# Patient Record
Sex: Male | Born: 2000 | Race: White | Hispanic: No | Marital: Married | Smoking: Never smoker
Health system: Southern US, Community
[De-identification: ages and names within clinical notes are randomized; demographics above are authoritative.]

## PROBLEM LIST (undated history)

## (undated) DIAGNOSIS — G4700 Insomnia, unspecified: Secondary | ICD-10-CM

## (undated) DIAGNOSIS — F938 Other childhood emotional disorders: Secondary | ICD-10-CM

## (undated) HISTORY — PX: TONSILLECTOMY: SUR1361

---
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
## 2018-11-21 ENCOUNTER — Emergency Department (HOSPITAL_COMMUNITY): Payer: Managed Care, Other (non HMO)

## 2018-11-21 ENCOUNTER — Encounter (HOSPITAL_COMMUNITY): Payer: Self-pay | Admitting: *Deleted

## 2018-11-21 ENCOUNTER — Emergency Department (HOSPITAL_COMMUNITY)
Admission: EM | Admit: 2018-11-21 | Discharge: 2018-11-21 | Disposition: A | Discharge status: Home / Self Care | Payer: Managed Care, Other (non HMO) | Attending: Emergency Medicine | Admitting: Emergency Medicine

## 2018-11-21 ENCOUNTER — Other Ambulatory Visit: Payer: Self-pay

## 2018-11-21 DIAGNOSIS — S60511A Abrasion of right hand, initial encounter: Secondary | ICD-10-CM | POA: Insufficient documentation

## 2018-11-21 DIAGNOSIS — Z23 Encounter for immunization: Secondary | ICD-10-CM | POA: Insufficient documentation

## 2018-11-21 DIAGNOSIS — T148XXA Other injury of unspecified body region, initial encounter: Secondary | ICD-10-CM

## 2018-11-21 DIAGNOSIS — S30810A Abrasion of lower back and pelvis, initial encounter: Secondary | ICD-10-CM | POA: Diagnosis not present

## 2018-11-21 DIAGNOSIS — S82892A Other fracture of left lower leg, initial encounter for closed fracture: Secondary | ICD-10-CM | POA: Diagnosis not present

## 2018-11-21 DIAGNOSIS — Y92410 Unspecified street and highway as the place of occurrence of the external cause: Secondary | ICD-10-CM | POA: Insufficient documentation

## 2018-11-21 DIAGNOSIS — S93402A Sprain of unspecified ligament of left ankle, initial encounter: Secondary | ICD-10-CM

## 2018-11-21 DIAGNOSIS — Y999 Unspecified external cause status: Secondary | ICD-10-CM | POA: Diagnosis not present

## 2018-11-21 DIAGNOSIS — Y939 Activity, unspecified: Secondary | ICD-10-CM | POA: Insufficient documentation

## 2018-11-21 DIAGNOSIS — S99912A Unspecified injury of left ankle, initial encounter: Secondary | ICD-10-CM | POA: Diagnosis present

## 2018-11-21 MED ORDER — TETANUS-DIPHTH-ACELL PERTUSSIS 5-2.5-18.5 LF-MCG/0.5 IM SUSP
0.5000 mL | Freq: Once | INTRAMUSCULAR | Status: AC
  Administered 2018-11-21: 21:00:00 0.5 mL via INTRAMUSCULAR
  Filled 2018-11-21: qty 0.5

## 2018-11-21 MED ORDER — CYCLOBENZAPRINE HCL 10 MG PO TABS: 10.0000 mg | ORAL_TABLET | Freq: Two times a day (BID) | ORAL | 0 refills | Status: AC | PRN

## 2018-11-21 MED ORDER — OXYCODONE-ACETAMINOPHEN 5-325 MG PO TABS
1.0000 | ORAL_TABLET | Freq: Once | ORAL | Status: AC
  Administered 2018-11-21: 1 via ORAL
  Filled 2018-11-21: qty 1

## 2018-11-21 MED ORDER — SILVER SULFADIAZINE 1 % EX CREA
TOPICAL_CREAM | Freq: Once | CUTANEOUS | Status: AC
  Administered 2018-11-21: 21:00:00 via TOPICAL
  Filled 2018-11-21: qty 50

## 2018-11-21 MED ORDER — HYDROCODONE-ACETAMINOPHEN 5-325 MG PO TABS: 1.0000 | ORAL_TABLET | Freq: Four times a day (QID) | ORAL | 0 refills | Status: AC | PRN
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
## 2018-11-21 NOTE — Discharge Instructions (Addendum)
Please read instructions below. Apply ice to your areas of pain for 20 minutes at a time.  Keep your foot elevated as much as possible. Keep your wounds clean.  You can apply the Silvadene cream 1-2 times daily as needed for pain. You can take 600 mg of Advil/ibuprofen every 6 hours as needed for pain. You can take flexeril every 12 hours as needed for muscle spasm. Be aware this medication can make you drowsy. You can take hydrocodone every 6 hours as needed for severe pain.  There is Tylenol in this medication.  Be aware this medication can make you drowsy, do not drive or drink alcohol while taking this medication. Schedule an appointment with the orthopedic specialist in 1 week to follow-up on your injury.   Return to the ER for severe headache, vision changes, if new numbness or tingling in your arms or legs, inability to urinate, inability to hold your bowels, or weakness in your extremities.

## 2018-11-21 NOTE — ED Notes (Signed)
Bed: WA02 Expected date:  Expected time:  Means of arrival:  Comments: Triage 1  
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
## 2019-12-25 ENCOUNTER — Emergency Department (HOSPITAL_BASED_OUTPATIENT_CLINIC_OR_DEPARTMENT_OTHER): Payer: Managed Care, Other (non HMO)

## 2019-12-25 ENCOUNTER — Other Ambulatory Visit: Payer: Self-pay

## 2019-12-25 ENCOUNTER — Encounter (HOSPITAL_BASED_OUTPATIENT_CLINIC_OR_DEPARTMENT_OTHER): Payer: Self-pay | Admitting: *Deleted

## 2019-12-25 ENCOUNTER — Emergency Department (HOSPITAL_BASED_OUTPATIENT_CLINIC_OR_DEPARTMENT_OTHER)
Admission: EM | Admit: 2019-12-25 | Discharge: 2019-12-25 | Disposition: A | Discharge status: Home / Self Care | Payer: Managed Care, Other (non HMO) | Attending: Emergency Medicine | Admitting: Emergency Medicine

## 2019-12-25 DIAGNOSIS — Y999 Unspecified external cause status: Secondary | ICD-10-CM | POA: Insufficient documentation

## 2019-12-25 DIAGNOSIS — Y9289 Other specified places as the place of occurrence of the external cause: Secondary | ICD-10-CM | POA: Diagnosis not present

## 2019-12-25 DIAGNOSIS — S8991XA Unspecified injury of right lower leg, initial encounter: Secondary | ICD-10-CM | POA: Diagnosis not present

## 2019-12-25 DIAGNOSIS — S80811A Abrasion, right lower leg, initial encounter: Secondary | ICD-10-CM | POA: Insufficient documentation

## 2019-12-25 DIAGNOSIS — M79604 Pain in right leg: Secondary | ICD-10-CM | POA: Diagnosis not present

## 2019-12-25 DIAGNOSIS — L03115 Cellulitis of right lower limb: Secondary | ICD-10-CM

## 2019-12-25 DIAGNOSIS — Y9389 Activity, other specified: Secondary | ICD-10-CM | POA: Insufficient documentation

## 2019-12-25 DIAGNOSIS — M7989 Other specified soft tissue disorders: Secondary | ICD-10-CM

## 2019-12-25 DIAGNOSIS — T07XXXA Unspecified multiple injuries, initial encounter: Secondary | ICD-10-CM

## 2019-12-25 MED ORDER — CEPHALEXIN 500 MG PO CAPS: 500.0000 mg | ORAL_CAPSULE | Freq: Four times a day (QID) | ORAL | 0 refills | Status: AC
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
## 2019-12-25 NOTE — Discharge Instructions (Signed)
Take Keflex as prescribed and complete the full course. STOP using peroxide to clean wounds. Use a mild soap, apply Bacitracin ointment to areas twice daily. Recheck with your doctor in 2 days, return to ER for fever, worsening or concerning symptoms.

## 2020-11-17 IMAGING — US US EXTREM LOW VENOUS*R*
1 series · 13 of 24 positions shown · non-contrast
Comparison: None.

CLINICAL DATA: Right leg pain and swelling, recent injury



[Series 1: us extrem low venous*right* · 13 of 34 slices shown]
[im 1/34]
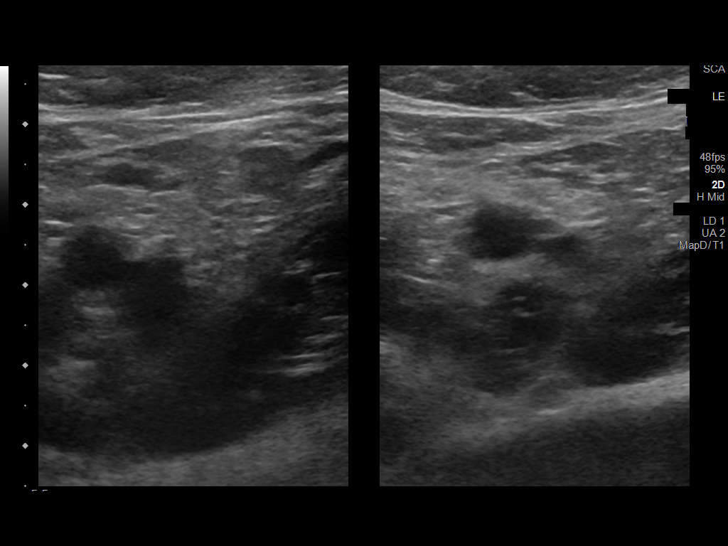
[im 3/34]
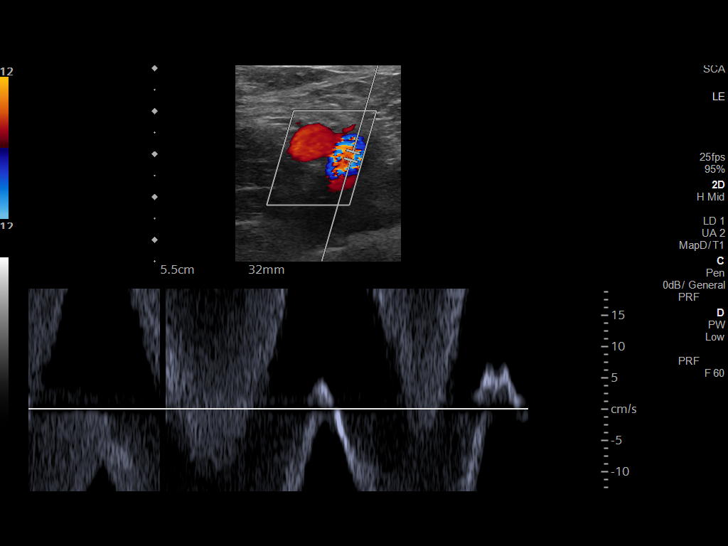
[im 6/34]
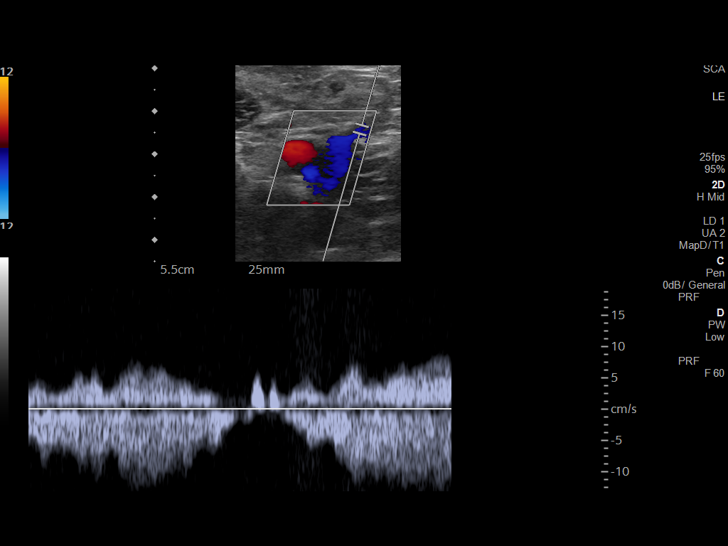
[im 9/34]
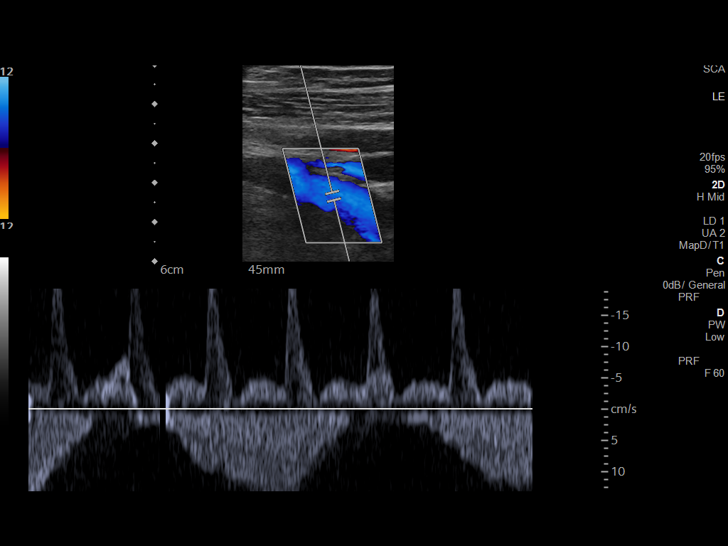
[im 12/34]
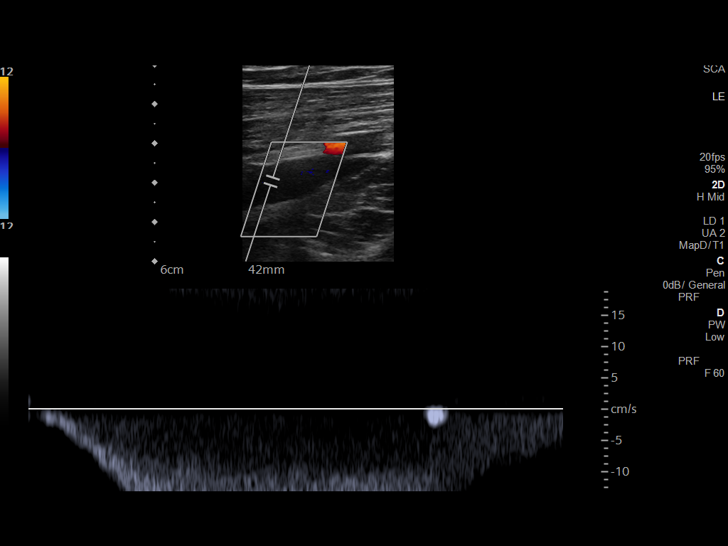
[im 15/34]
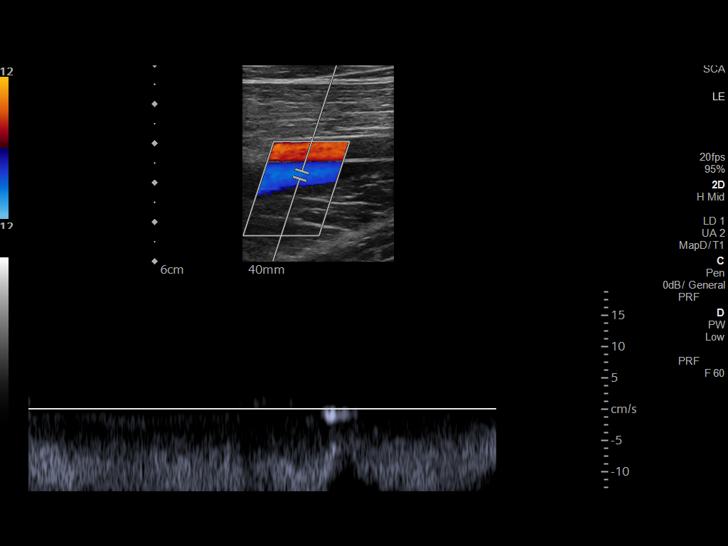
[im 18/34]
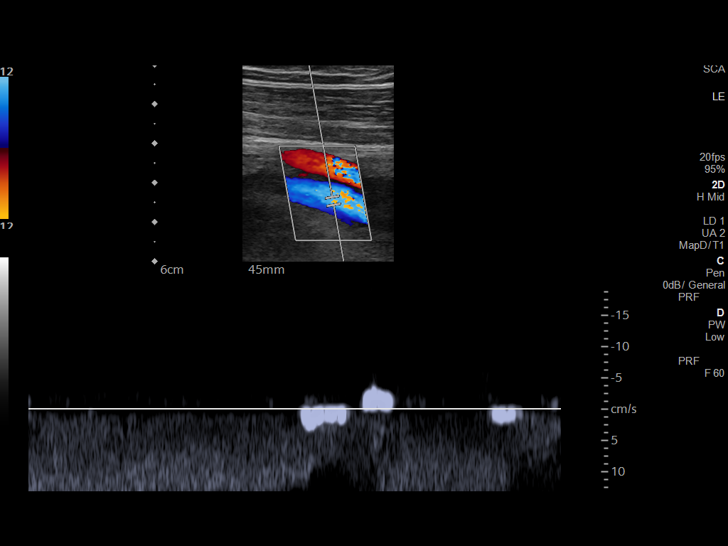
[im 19/34]
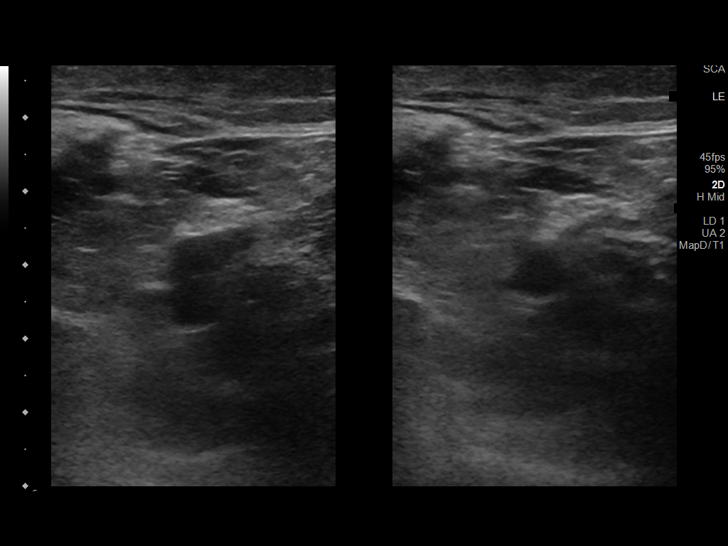
[im 22/34]
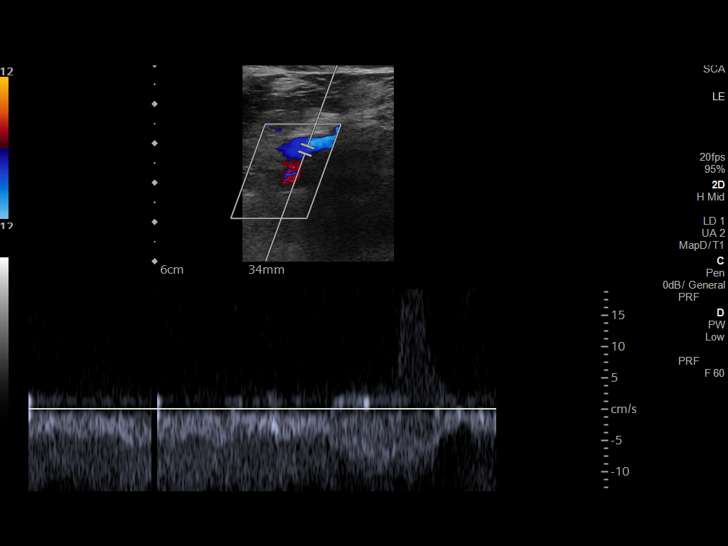
[im 25/34]
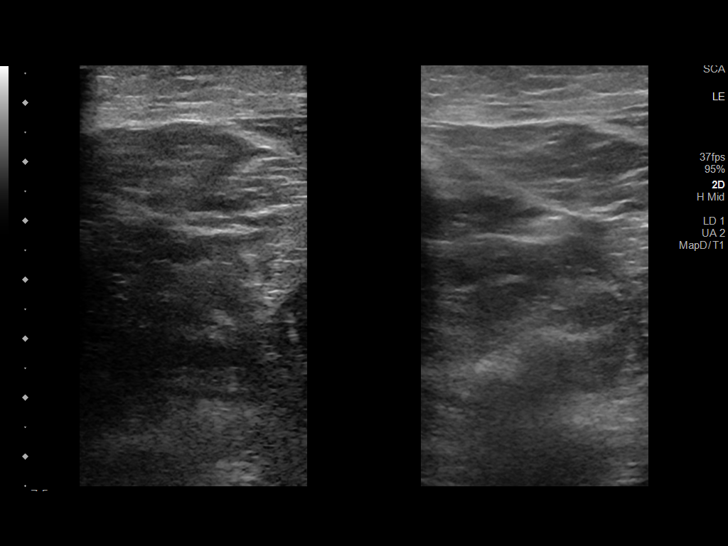
[im 28/34]
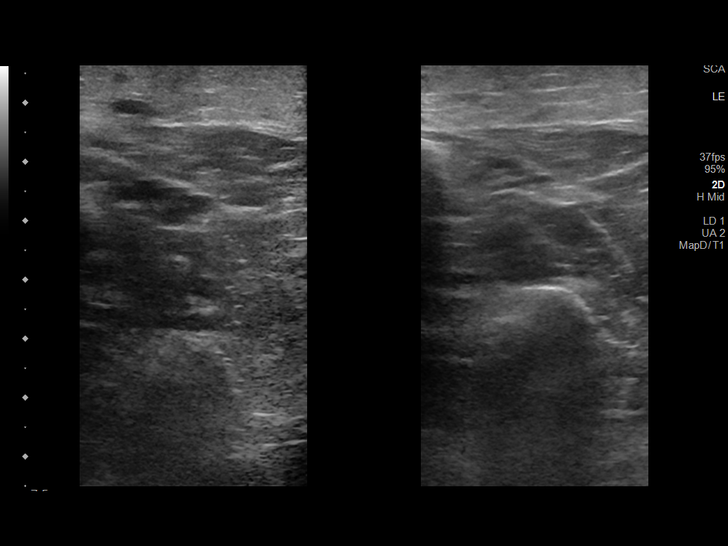
[im 31/34]
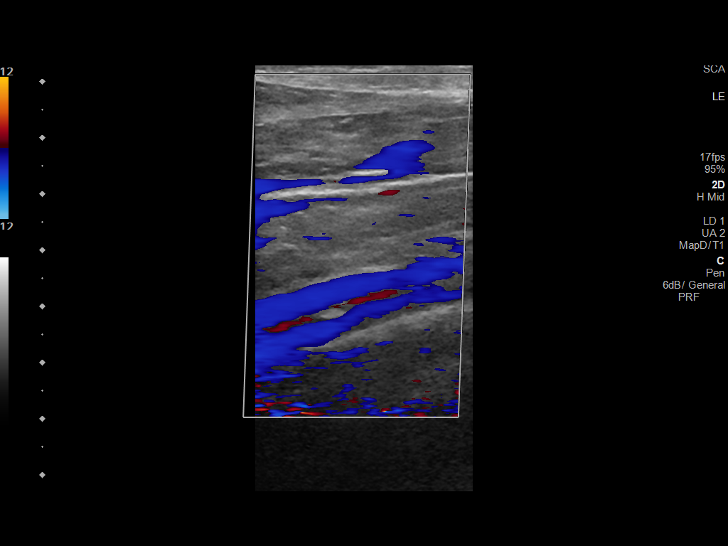
[im 34/34]
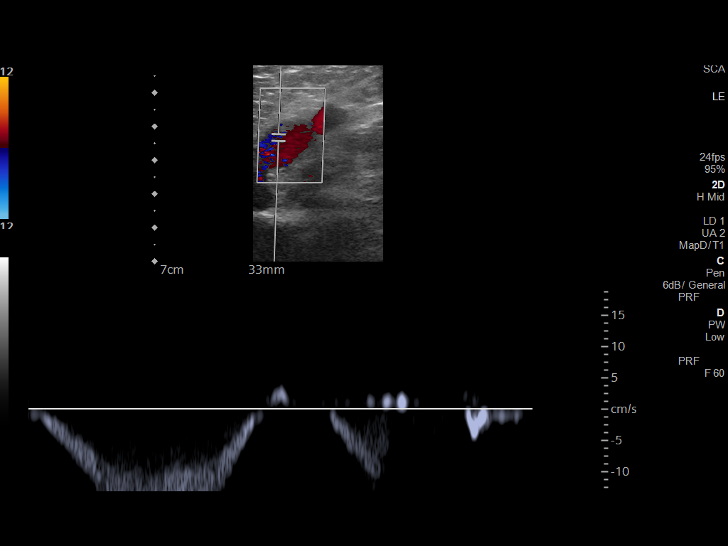

[13 of 24 positions shown; findings below may reference images not displayed]

FINDINGS: Contralateral Common Femoral Vein: Respiratory phasicity is normal
and symmetric with the symptomatic side. No evidence of thrombus.
Normal compressibility.

Common Femoral Vein: No evidence of thrombus. Normal
compressibility, respiratory phasicity and response to augmentation.

Saphenofemoral Junction: No evidence of thrombus. Normal
compressibility and flow on color Doppler imaging.

Profunda Femoral Vein: No evidence of thrombus. Normal
compressibility and flow on color Doppler imaging.

Femoral Vein: No evidence of thrombus. Normal compressibility,
respiratory phasicity and response to augmentation.

Popliteal Vein: No evidence of thrombus. Normal compressibility,
respiratory phasicity and response to augmentation.

Calf Veins: No evidence of thrombus. Normal compressibility and flow
on color Doppler imaging.

Superficial Great Saphenous Vein: No evidence of thrombus. Normal
compressibility.

Venous Reflux:  None.

Other Findings:  None.
IMPRESSION: No evidence of deep venous thrombosis.

## 2022-12-23 ENCOUNTER — Other Ambulatory Visit: Payer: Self-pay

## 2022-12-23 ENCOUNTER — Emergency Department (HOSPITAL_COMMUNITY)
Admission: EM | Admit: 2022-12-23 | Discharge: 2022-12-23 | Disposition: A | Discharge status: Home / Self Care | Payer: BC Managed Care – PPO | Attending: Emergency Medicine | Admitting: Emergency Medicine

## 2022-12-23 DIAGNOSIS — K209 Esophagitis, unspecified without bleeding: Secondary | ICD-10-CM

## 2022-12-23 DIAGNOSIS — R09A2 Foreign body sensation, throat: Secondary | ICD-10-CM | POA: Diagnosis not present

## 2022-12-23 DIAGNOSIS — X58XXXA Exposure to other specified factors, initial encounter: Secondary | ICD-10-CM | POA: Insufficient documentation

## 2022-12-23 DIAGNOSIS — T18128A Food in esophagus causing other injury, initial encounter: Secondary | ICD-10-CM | POA: Diagnosis present

## 2022-12-23 NOTE — ED Triage Notes (Signed)
Pt arrived via POV. Pt choked on piece of chicken and required the heimlich maneuver. Food was dislodged enough so they could breathe, but pt can not swallow. Can not keep down saliva.   No hx GERD

## 2022-12-23 NOTE — ED Provider Notes (Signed)
[facility] EMERGENCY DEPARTMENT AT [facility] Provider Note   CSN: [id number] Arrival date & time: 12/23/22  1416     History  Chief Complaint  Patient presents with   food bolus    Jerry Goodwin is a 22 y.o. male.  22 year old male presents after having chicken get stuck in his esophagus.  States that he felt a foreign body sensation in his upper throat.  His friend to the hemic maneuver and food was dislodged enough.  Patient states he feels like the food is stuck.  Denies any prior history of esophageal stricture although has a family history of this.  Does not have any regular medical problems.  States he can control his saliva.       Home Medications Prior to Admission medications   Medication Sig Start Date End Date Taking? Authorizing Provider  cyclobenzaprine (FLEXERIL) 10 MG tablet Take 1 tablet (10 mg total) by mouth 2 (two) times daily as needed for muscle spasms. 11/21/18   Robinson, [place] N, PA-C  escitalopram (LEXAPRO) 10 MG tablet Take 1 tablet (10 mg total) by mouth daily. Patient not taking: Reported on 11/21/2018 01/27/16   Denzil Magnuson, NP  HYDROcodone-acetaminophen (NORCO/VICODIN) 5-325 MG tablet Take 1-2 tablets by mouth every 6 (six) hours as needed for severe pain. 11/21/18   Robinson, [place] N, PA-C      Allergies    Other and Pistachio nut extract    Review of Systems   Review of Systems  All other systems reviewed and are negative.   Physical Exam Updated Vital Signs BP (!) 135/94 (BP Location: Left Arm)   Pulse 97   Temp 98.5 F (36.9 C) (Oral)   Resp 17   Ht 1.702 m (5\' 7" )   Wt 97.5 kg   SpO2 95%   BMI 33.67 kg/m  Physical Exam Vitals and nursing note reviewed.  Constitutional:      General: He is not in acute distress.    Appearance: Normal appearance. He is well-developed. He is not toxic-appearing.  HENT:     Head: Normocephalic and atraumatic.  Eyes:     General: Lids are normal.     Conjunctiva/sclera:  Conjunctivae normal.     Pupils: Pupils are equal, round, and reactive to light.  Neck:     Thyroid: No thyroid mass.     Trachea: No tracheal deviation.  Cardiovascular:     Rate and Rhythm: Normal rate and regular rhythm.     Heart sounds: Normal heart sounds. No murmur heard.    No gallop.  Pulmonary:     Effort: Pulmonary effort is normal. No respiratory distress.     Breath sounds: Normal breath sounds. No stridor. No decreased breath sounds, wheezing, rhonchi or rales.  Abdominal:     General: There is no distension.     Palpations: Abdomen is soft.     Tenderness: There is no abdominal tenderness. There is no rebound.  Musculoskeletal:        General: No tenderness. Normal range of motion.     Cervical back: Normal range of motion and neck supple.  Skin:    General: Skin is warm and dry.     Findings: No abrasion or rash.  Neurological:     Mental Status: He is alert and oriented to person, place, and time. Mental status is at baseline.     GCS: GCS eye subscore is 4. GCS verbal subscore is 5. GCS motor subscore is 6.  Cranial Nerves: No cranial nerve deficit.     Sensory: No sensory deficit.     Motor: Motor function is intact.  Psychiatric:        Attention and Perception: Attention normal.        Speech: Speech normal.        Behavior: Behavior normal.     ED Results / Procedures / Treatments   Labs (all labs ordered are listed, but only abnormal results are displayed) Labs Reviewed - No data to display  EKG None  Radiology No results found.  Procedures Procedures    Medications Ordered in ED Medications - No data to display  ED Course/ Medical Decision Making/ A&P                             Medical Decision Making  I gave patient a cup of water here to drink.  Patient burped and felt better again.  Drink the water and felt the food go down.  He was given the remaining couple water and take it without incident.  He will be discharged home and  given GI referral.  Will place on PPI        Final Clinical Impression(s) / ED Diagnoses Final diagnoses:  None    Rx / DC Orders ED Discharge Orders     None         Lorre Nick, MD 12/23/22 1447

## 2023-08-09 ENCOUNTER — Emergency Department (HOSPITAL_COMMUNITY)
Admission: EM | Admit: 2023-08-09 | Discharge: 2023-08-09 | Disposition: A | Discharge status: Home / Self Care | Payer: BC Managed Care – PPO | Attending: Emergency Medicine | Admitting: Emergency Medicine

## 2023-08-09 ENCOUNTER — Encounter (HOSPITAL_COMMUNITY): Payer: Self-pay

## 2023-08-09 ENCOUNTER — Other Ambulatory Visit: Payer: Self-pay

## 2023-08-09 DIAGNOSIS — W540XXA Bitten by dog, initial encounter: Secondary | ICD-10-CM | POA: Diagnosis not present

## 2023-08-09 DIAGNOSIS — Y92009 Unspecified place in unspecified non-institutional (private) residence as the place of occurrence of the external cause: Secondary | ICD-10-CM | POA: Diagnosis not present

## 2023-08-09 DIAGNOSIS — S51831A Puncture wound without foreign body of right forearm, initial encounter: Secondary | ICD-10-CM | POA: Diagnosis not present

## 2023-08-09 DIAGNOSIS — S51832A Puncture wound without foreign body of left forearm, initial encounter: Secondary | ICD-10-CM | POA: Diagnosis not present

## 2023-08-09 DIAGNOSIS — S41111A Laceration without foreign body of right upper arm, initial encounter: Secondary | ICD-10-CM

## 2023-08-09 DIAGNOSIS — S51011A Laceration without foreign body of right elbow, initial encounter: Secondary | ICD-10-CM | POA: Insufficient documentation

## 2023-08-09 MED ORDER — AMOXICILLIN-POT CLAVULANATE 875-125 MG PO TABS: 1.0000 | ORAL_TABLET | Freq: Two times a day (BID) | ORAL | 0 refills | Status: AC

## 2023-08-09 MED ORDER — LIDOCAINE-EPINEPHRINE (PF) 2 %-1:200000 IJ SOLN
10.0000 mL | Freq: Once | INTRAMUSCULAR | Status: DC
  Filled 2023-08-09: qty 20

## 2023-08-09 NOTE — ED Triage Notes (Signed)
Pt was bit by his own dogs while trying to separate them from fighting. Pt states the dogs vaccines were up to date, and he said he has had a tetanus shot within the last 5 yrs. Concerned the cut on his right elbow may need stiches.

## 2023-08-09 NOTE — Discharge Instructions (Addendum)
You were seen in the ER today for concerns of an animal bite. You had one larger bite repaired with sutures loosely to allow for drainage to continue to occur. Please allow this area at least 7-10 days before coming back for a wound evaluation or suture removal. I have also sent a prescription for Augmentin to your pharmacy to reduce the risk of infection. Return to the ER if any new or worsening symptoms arise.

## 2023-08-10 NOTE — ED Provider Notes (Signed)
 [name] Provider Note   CSN: [id number] Arrival date & time: 08/09/23  1045     History Chief Complaint  Patient presents with   Animal Bite         Jerry Goodwin is a 22 y.o. male.  Patient presents to the emergency department concerns of a dog bite.  He reports that he was try to break up a fight between his dogs at home when he was bitten by his dog's.  She said both of his dogs are up-to-date on rabies vaccinations.  Concerned about a cut to the right elbow but has had a harder time stopping the bleeding.  Not on blood thinners.  No bleeding disorders.  No medications taken prior to arriving.  Denies any weakness, lightheadedness, or near syncope.  Up-to-date on Tdap.   Animal Bite      Home Medications Prior to Admission medications   Medication Sig Start Date End Date Taking? Authorizing Provider  amoxicillin-clavulanate (AUGMENTIN) 875-125 MG tablet Take 1 tablet by mouth every 12 (twelve) hours. 08/09/23  Yes Smitty Knudsen, PA-C  cyclobenzaprine (FLEXERIL) 10 MG tablet Take 1 tablet (10 mg total) by mouth 2 (two) times daily as needed for muscle spasms. 11/21/18   Robinson, [place] N, PA-C  escitalopram (LEXAPRO) 10 MG tablet Take 1 tablet (10 mg total) by mouth daily. Patient not taking: Reported on 11/21/2018 01/27/16   Denzil Magnuson, NP  HYDROcodone-acetaminophen (NORCO/VICODIN) 5-325 MG tablet Take 1-2 tablets by mouth every 6 (six) hours as needed for severe pain. 11/21/18   Robinson, [place] N, PA-C      Allergies    Other and Pistachio nut extract    Review of Systems   Review of Systems  Skin:  Positive for wound.  All other systems reviewed and are negative.   Physical Exam Updated Vital Signs BP (!) 153/90   Pulse 94   Temp 98.1 F (36.7 C) (Oral)   Resp 15   Ht 5\' 7"  (1.702 m)   Wt 97.5 kg   SpO2 98%   BMI 33.67 kg/m  Physical Exam Vitals and nursing note reviewed.  Constitutional:       General: He is not in acute distress.    Appearance: Normal appearance. He is not ill-appearing.  HENT:     Head: Normocephalic and atraumatic.  Eyes:     General: No scleral icterus.       Right eye: No discharge.        Left eye: No discharge.  Cardiovascular:     Rate and Rhythm: Normal rate and regular rhythm.  Skin:    General: Skin is warm and dry.     Findings: Lesion present. No rash.          Comments: 4 cm linear lesion along the medial aspect of the right elbow crease with some active bleeding still present.  Puncture type lesions noted to the posterior aspect of the left forearm none minimal to repair as well as the anterior aspect of the left forearm.  Posterior right forearm has another puncture wound present as well.  All with bleeding well-controlled.  Neurological:     Mental Status: He is alert.     ED Results / Procedures / Treatments   Labs (all labs ordered are listed, but only abnormal results are displayed) Labs Reviewed - No data to display  EKG None  Radiology No results found.  Procedures Procedures    Medications  Ordered in ED Medications - No data to display  ED Course/ Medical Decision Making/ A&P                                 Medical Decision Making Risk Prescription drug management.   This patient presents to the ED for concern of dog bite.  Differential diagnosis includes skin laceration, rabies prophylaxis, cellulitis, arterial injury   Medicines ordered and prescription drug management:  I ordered medication including lidocaine with epinephrine for anesthesia Reevaluation of the patient after these medicines showed that the patient improved I have reviewed the patients home medicines and have made adjustments as needed   Problem List / ED Course:  Patient presents to the emergency department following a dog bite.  States that he was bitten by his own dog yesterday to break up a fight between them.  Reports the dogs are  up-to-date on rabies vaccinations.  Patient is up-to-date on Tdap.  States he is concerned about a cut to the inside of his right elbow.  States that there has been no power to get the bleeding to stop but does have some other puncture wounds. Given location and size of the laceration, loose wound closure is indicated after significant irrigation.  Patient in agreement with this plan.  Will plan on closure with outpatient antibiotics to prevent infection Patient tolerated wound closure without difficulty.  Given area, suspect this likely take 10 to 14 days for proper wound healing.  Advised patient to come back to the emergency department for suture removal or follow-up with his primary care provider for removal.  Discussed return precautions such as evidence of infection such as streaking cellulitis, fevers, body aches or general malaise.  Patient discharged home with prescription for Augmentin to take twice daily for the next 10 days.  No other acute concerns at this time.  Patient discharged home in stable condition.  Final Clinical Impression(s) / ED Diagnoses Final diagnoses:  Dog bite, initial encounter  Laceration of right upper extremity with complication, initial encounter    Rx / DC Orders ED Discharge Orders          Ordered    amoxicillin-clavulanate (AUGMENTIN) 875-125 MG tablet  Every 12 hours        08/09/23 1415              Smitty Knudsen, PA-C 08/10/23 1513    Alvira Monday, MD 08/11/23 [phone]
